# Patient Record
Sex: Female | Born: 1975 | Race: Black or African American | Hispanic: No | Marital: Married | Smoking: Never smoker
Health system: Southern US, Community
[De-identification: ages and names within clinical notes are randomized; demographics above are authoritative.]

## PROBLEM LIST (undated history)

## (undated) HISTORY — PX: CHOLECYSTECTOMY: SHX55

---

## 2000-08-20 ENCOUNTER — Other Ambulatory Visit: Admission: RE | Admit: 2000-08-20 | Discharge: 2000-08-20 | Payer: Self-pay | Admitting: Obstetrics & Gynecology

## 2001-10-21 ENCOUNTER — Other Ambulatory Visit: Admission: RE | Admit: 2001-10-21 | Discharge: 2001-10-21 | Payer: Self-pay | Admitting: Obstetrics & Gynecology

## 2002-06-16 ENCOUNTER — Observation Stay (HOSPITAL_COMMUNITY): Admission: RE | Admit: 2002-06-16 | Discharge: 2002-06-17 | Payer: Self-pay | Admitting: General Surgery

## 2002-06-16 ENCOUNTER — Encounter (INDEPENDENT_AMBULATORY_CARE_PROVIDER_SITE_OTHER): Payer: Self-pay | Admitting: Specialist

## 2002-06-16 ENCOUNTER — Encounter: Payer: Self-pay | Admitting: General Surgery

## 2002-09-01 ENCOUNTER — Ambulatory Visit (HOSPITAL_COMMUNITY): Admission: RE | Admit: 2002-09-01 | Discharge: 2002-09-01 | Payer: Self-pay | Admitting: Obstetrics & Gynecology

## 2002-09-01 ENCOUNTER — Encounter (INDEPENDENT_AMBULATORY_CARE_PROVIDER_SITE_OTHER): Payer: Self-pay

## 2002-09-01 ENCOUNTER — Encounter (INDEPENDENT_AMBULATORY_CARE_PROVIDER_SITE_OTHER): Payer: Self-pay | Admitting: Specialist

## 2002-10-18 ENCOUNTER — Inpatient Hospital Stay (HOSPITAL_COMMUNITY): Admission: RE | Admit: 2002-10-18 | Discharge: 2002-10-19 | Payer: Self-pay | Admitting: Obstetrics & Gynecology

## 2002-10-18 ENCOUNTER — Encounter (INDEPENDENT_AMBULATORY_CARE_PROVIDER_SITE_OTHER): Payer: Self-pay | Admitting: Specialist

## 2002-10-18 ENCOUNTER — Encounter (INDEPENDENT_AMBULATORY_CARE_PROVIDER_SITE_OTHER): Payer: Self-pay

## 2002-11-07 ENCOUNTER — Other Ambulatory Visit: Admission: RE | Admit: 2002-11-07 | Discharge: 2002-11-07 | Payer: Self-pay | Admitting: Obstetrics & Gynecology

## 2003-07-24 ENCOUNTER — Emergency Department (HOSPITAL_COMMUNITY): Admission: EM | Admit: 2003-07-24 | Discharge: 2003-07-25 | Payer: Self-pay | Admitting: Emergency Medicine

## 2003-12-29 ENCOUNTER — Other Ambulatory Visit: Admission: RE | Admit: 2003-12-29 | Discharge: 2003-12-29 | Payer: Self-pay | Admitting: Obstetrics & Gynecology

## 2004-10-31 ENCOUNTER — Ambulatory Visit: Payer: Self-pay | Admitting: Internal Medicine

## 2004-11-01 ENCOUNTER — Emergency Department (HOSPITAL_COMMUNITY): Admission: EM | Admit: 2004-11-01 | Discharge: 2004-11-02 | Payer: Self-pay | Admitting: Emergency Medicine

## 2005-06-06 ENCOUNTER — Other Ambulatory Visit: Admission: RE | Admit: 2005-06-06 | Discharge: 2005-06-06 | Payer: Self-pay | Admitting: Obstetrics and Gynecology

## 2006-07-07 ENCOUNTER — Ambulatory Visit: Payer: Self-pay | Admitting: Internal Medicine

## 2006-07-07 LAB — CONVERTED CEMR LAB
ALT: 15 units/L (ref 0–40)
AST: 16 units/L (ref 0–37)
Alkaline Phosphatase: 81 units/L (ref 39–117)
Basophils Relative: 0.4 % (ref 0.0–1.0)
Bilirubin, Direct: 0.1 mg/dL (ref 0.0–0.3)
CO2: 28 meq/L (ref 19–32)
Calcium: 8.8 mg/dL (ref 8.4–10.5)
Chloride: 108 meq/L (ref 96–112)
Eosinophils Absolute: 0.1 10*3/uL (ref 0.0–0.6)
Eosinophils Relative: 2.1 % (ref 0.0–5.0)
GFR calc non Af Amer: 104 mL/min
Glucose, Bld: 108 mg/dL — ABNORMAL HIGH (ref 70–99)
Ketones, ur: NEGATIVE mg/dL
LDL Cholesterol: 128 mg/dL — ABNORMAL HIGH (ref 0–99)
Nitrite: NEGATIVE
Platelets: 323 10*3/uL (ref 150–400)
RBC: 3.2 M/uL — ABNORMAL LOW (ref 3.87–5.11)
Specific Gravity, Urine: 1.025 (ref 1.000–1.03)
Total CHOL/HDL Ratio: 3.7
Total Protein, Urine: NEGATIVE mg/dL
Urine Glucose: NEGATIVE mg/dL
WBC: 5.6 10*3/uL (ref 4.5–10.5)
pH: 6 (ref 5.0–8.0)

## 2006-07-08 ENCOUNTER — Ambulatory Visit (HOSPITAL_COMMUNITY): Admission: RE | Admit: 2006-07-08 | Discharge: 2006-07-08 | Payer: Self-pay | Admitting: Obstetrics & Gynecology

## 2006-07-13 ENCOUNTER — Ambulatory Visit: Payer: Self-pay | Admitting: Internal Medicine

## 2007-01-02 ENCOUNTER — Encounter: Payer: Self-pay | Admitting: *Deleted

## 2007-12-16 ENCOUNTER — Encounter: Admission: RE | Admit: 2007-12-16 | Discharge: 2007-12-16 | Payer: Self-pay | Admitting: Chiropractic Medicine

## 2009-07-02 ENCOUNTER — Ambulatory Visit: Payer: Self-pay | Admitting: Internal Medicine

## 2009-07-02 LAB — CONVERTED CEMR LAB
ALT: 18 units/L (ref 0–35)
AST: 16 units/L (ref 0–37)
Albumin: 3.8 g/dL (ref 3.5–5.2)
Alkaline Phosphatase: 82 units/L (ref 39–117)
BUN: 8 mg/dL (ref 6–23)
Basophils Relative: 0.3 % (ref 0.0–3.0)
Bilirubin, Direct: 0.1 mg/dL (ref 0.0–0.3)
Chloride: 107 meq/L (ref 96–112)
Cholesterol: 175 mg/dL (ref 0–200)
Creatinine, Ser: 0.8 mg/dL (ref 0.4–1.2)
Eosinophils Relative: 1.9 % (ref 0.0–5.0)
Glucose, Bld: 98 mg/dL (ref 70–99)
Hemoglobin: 12.6 g/dL (ref 12.0–15.0)
Ketones, ur: NEGATIVE mg/dL
LDL Cholesterol: 104 mg/dL — ABNORMAL HIGH (ref 0–99)
Lymphocytes Relative: 31.5 % (ref 12.0–46.0)
MCHC: 33.9 g/dL (ref 30.0–36.0)
Monocytes Relative: 8.2 % (ref 3.0–12.0)
Neutro Abs: 3.2 10*3/uL (ref 1.4–7.7)
Neutrophils Relative %: 58.1 % (ref 43.0–77.0)
RBC: 3.78 M/uL — ABNORMAL LOW (ref 3.87–5.11)
Total CHOL/HDL Ratio: 3
Total Protein, Urine: NEGATIVE mg/dL
Total Protein: 8.1 g/dL (ref 6.0–8.3)
Urine Glucose: NEGATIVE mg/dL
Urobilinogen, UA: 0.2 (ref 0.0–1.0)
WBC: 5.5 10*3/uL (ref 4.5–10.5)

## 2009-07-09 ENCOUNTER — Ambulatory Visit: Payer: Self-pay | Admitting: Internal Medicine

## 2009-07-09 DIAGNOSIS — A6000 Herpesviral infection of urogenital system, unspecified: Secondary | ICD-10-CM | POA: Insufficient documentation

## 2010-02-21 IMAGING — CR DG THORACOLUMBAR SPINE STANDING SCOLIOSIS
1 series · 3 of 3 positions shown · non-contrast
Comparison: Chest X-ray dated 11/01/2004

CLINICAL DATA: History of thoracolumbar scoliosis.  The patient
complains of pain.

THORACOLUMBAR SCOLIOSIS STUDY - STANDING VIEWS

[Series 1001: view not recorded · 0.40mm/px · 3 of 3 slices shown]
[im 1/3]
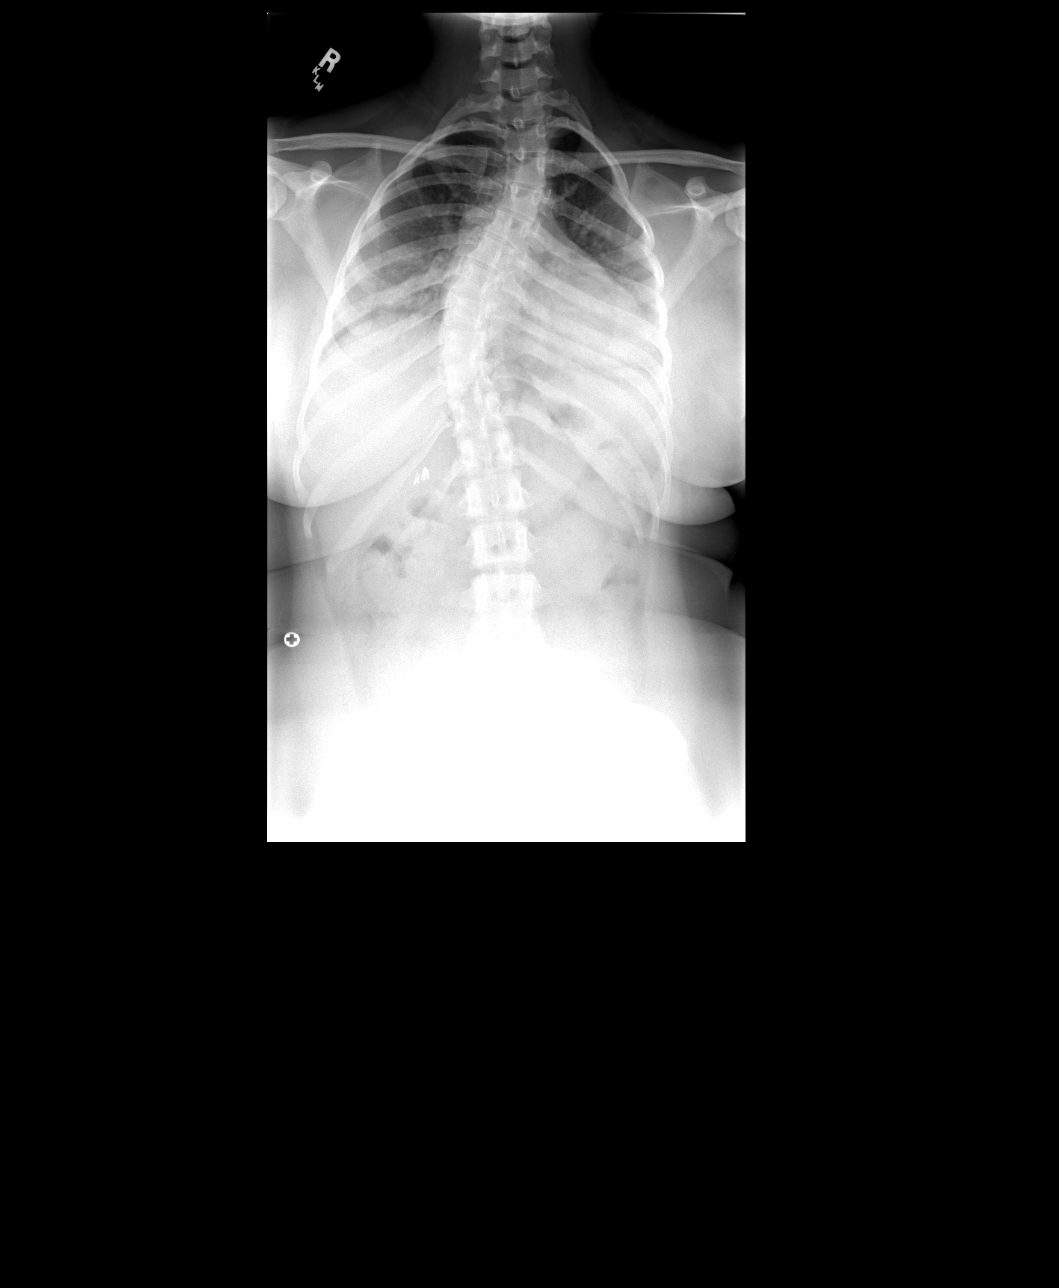
[im 2/3]
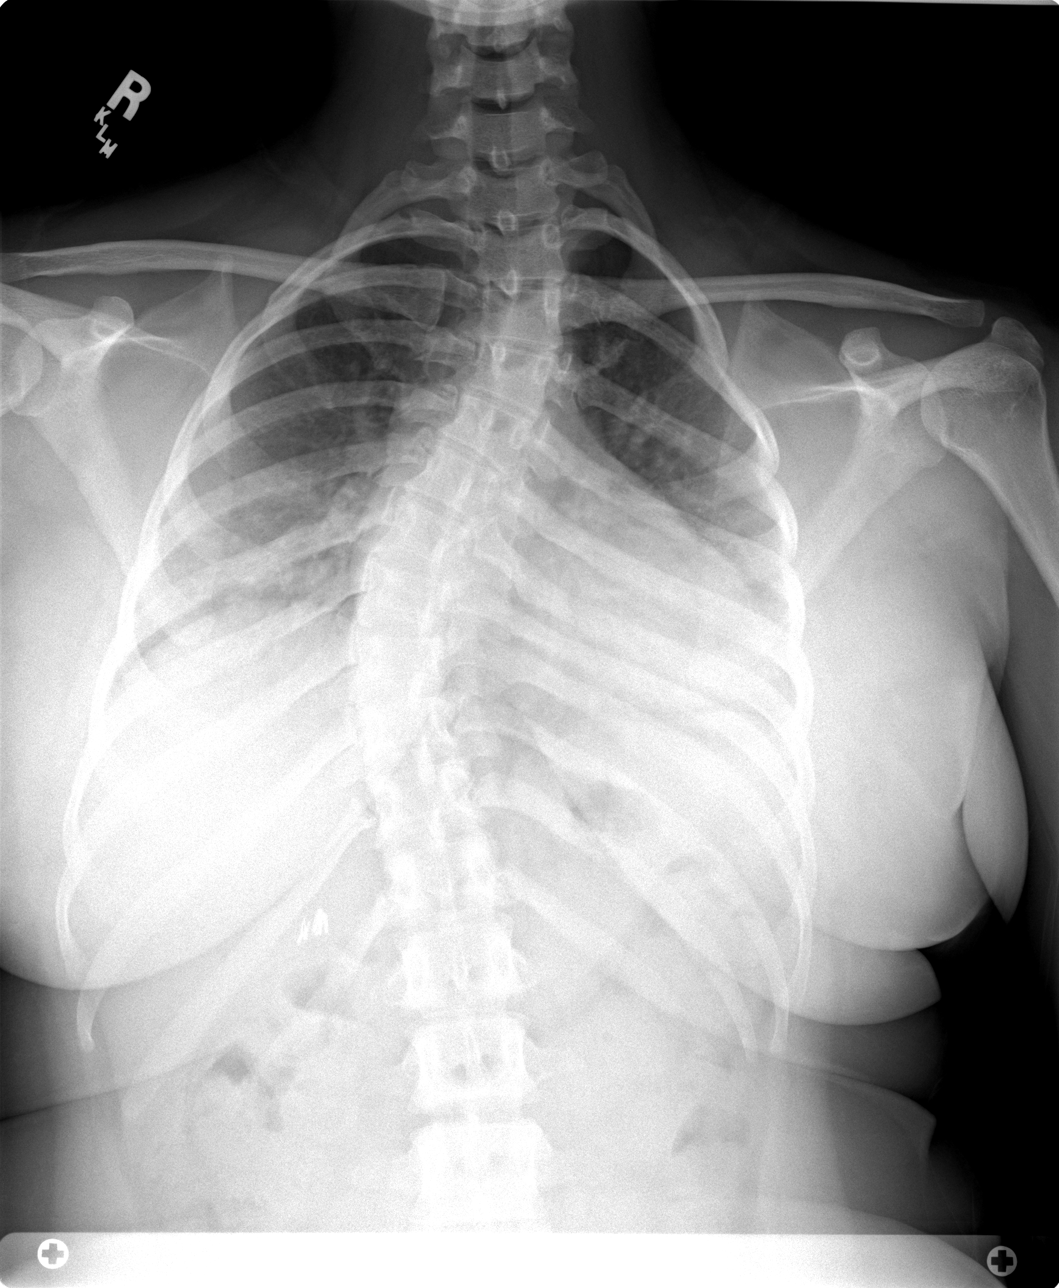
[im 3/3]
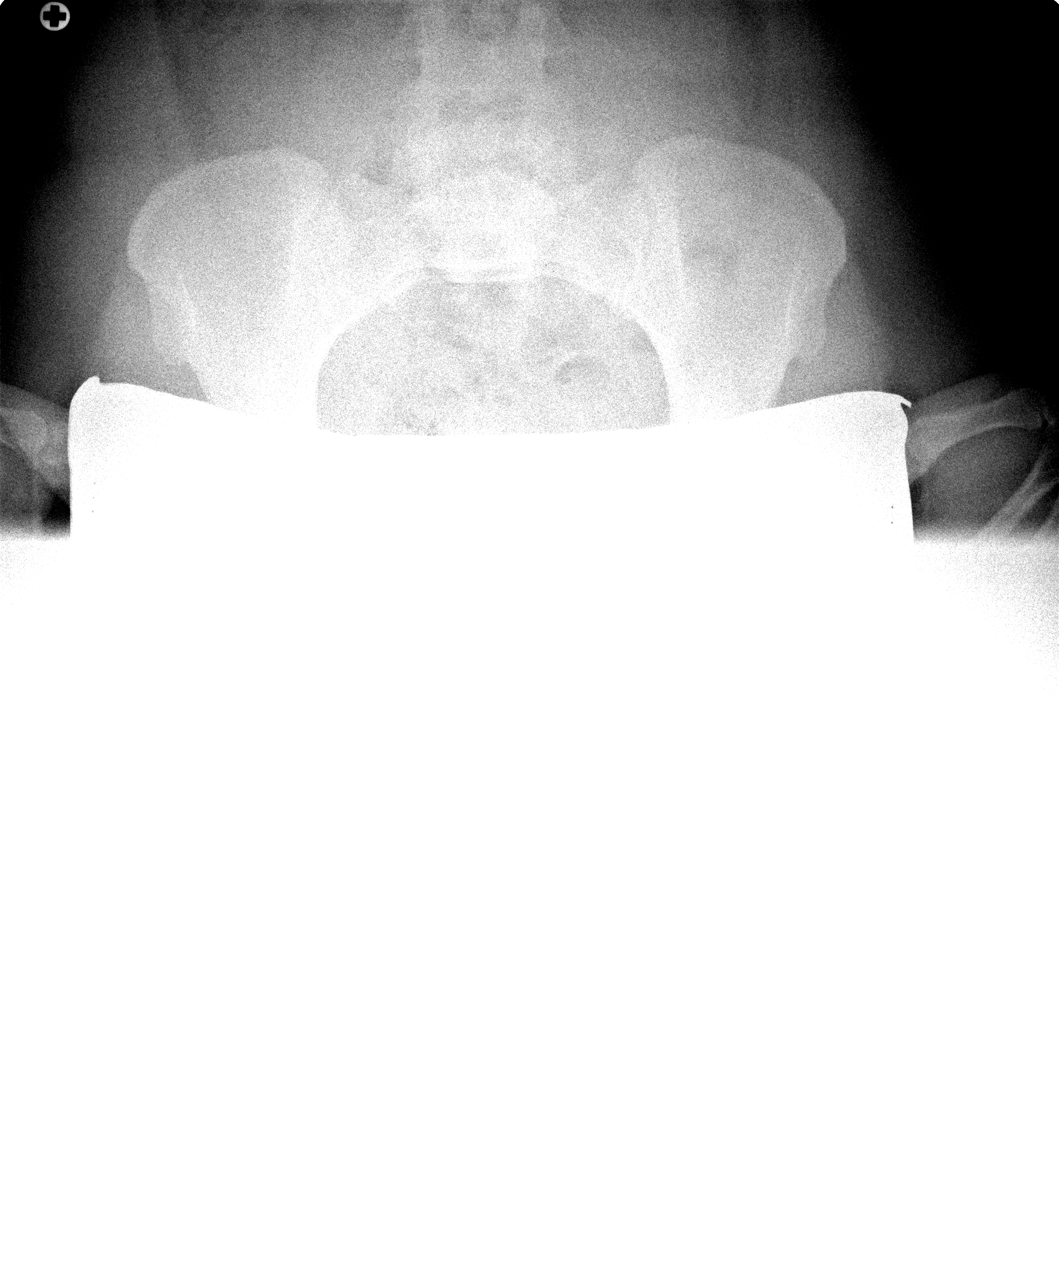

[3 of 3 positions shown; findings below may reference images not displayed]

FINDINGS: Compared to the prior chest x-ray, standing upright
thoracolumbar film demonstrates more pronounced thoracic scoliosis,
convex right.

Apex of curvature is centered at the T7-8 level.  Cobb angle was
estimated to be 31 degrees when measuring the angle between lines
drawn through the superior endplate of T5 and the inferior endplate
of T10.  No congenital bony abnormalities are identified in the
frontal projection.
IMPRESSION: Severe thoracic scoliosis which appears more pronounced compared to
a chest x-ray 3 years ago.  Apex of curvature is at the T7-8 level,
convex right.  Estimated Cobb angle is 31 degrees.

## 2010-06-11 NOTE — Assessment & Plan Note (Signed)
Summary: cpx,#/cd   Vital Signs:  Patient profile:   35 year old female Height:      61 inches Weight:      172 pounds BMI:     32.62 Temp:     99.9 degrees F oral Pulse rate:   93 / minute BP sitting:   102 / 62  (left arm)  Vitals Entered By: Tora Perches (July 09, 2009 9:26 AM) CC: cpx Is Patient Diabetic? No   CC:  cpx.  History of Present Illness: The patient presents for a wellness examination C/o genital rash q 1-2 months - painful blisters x years  Preventive Screening-Counseling & Management  Alcohol-Tobacco     Smoking Status: never  Caffeine-Diet-Exercise     Does Patient Exercise: yes  Current Medications (verified): 1)  None  Allergies (verified): No Known Drug Allergies  Past History:  Past Surgical History: Last updated: 01/02/2007 Cholecystectomy  Past Medical History: Gen herpes  Family History: GM cerv Ca  Social History: Occupation: event planner Married 12 and 16 Never Smoked Alcohol use-no Regular exercise-yes Zumba Does Patient Exercise:  yes  Review of Systems  The patient denies anorexia, fever, weight loss, weight gain, vision loss, decreased hearing, hoarseness, chest pain, syncope, dyspnea on exertion, peripheral edema, prolonged cough, headaches, hemoptysis, abdominal pain, melena, hematochezia, severe indigestion/heartburn, hematuria, incontinence, genital sores, muscle weakness, suspicious skin lesions, transient blindness, difficulty walking, depression, unusual weight change, abnormal bleeding, enlarged lymph nodes, angioedema, and breast masses.    Physical Exam  General:  overweight-appearing.   Head:  Normocephalic and atraumatic without obvious abnormalities. No apparent alopecia or balding. Eyes:  No corneal or conjunctival inflammation noted. EOMI. Perrla.  Ears:  External ear exam shows no significant lesions or deformities.  Otoscopic examination reveals clear canals, tympanic membranes are intact  bilaterally without bulging, retraction, inflammation or discharge. Hearing is grossly normal bilaterally. Nose:  External nasal examination shows no deformity or inflammation. Nasal mucosa are pink and moist without lesions or exudates. Mouth:  Oral mucosa and oropharynx without lesions or exudates.  Teeth in good repair. Neck:  No deformities, masses, or tenderness noted. Lungs:  Normal respiratory effort, chest expands symmetrically. Lungs are clear to auscultation, no crackles or wheezes. Heart:  Normal rate and regular rhythm. S1 and S2 normal without gallop, murmur, click, rub or other extra sounds. Abdomen:  Bowel sounds positive,abdomen soft and non-tender without masses, organomegaly or hernias noted. Msk:  No deformity or scoliosis noted of thoracic or lumbar spine.   Pulses:  R and L carotid,radial,femoral,dorsalis pedis and posterior tibial pulses are full and equal bilaterally Extremities:  No clubbing, cyanosis, edema, or deformity noted with normal full range of motion of all joints.   Neurologic:  No cranial nerve deficits noted. Station and gait are normal. Plantar reflexes are down-going bilaterally. DTRs are symmetrical throughout. Sensory, motor and coordinative functions appear intact. Skin:  Intact without suspicious lesions or rashes Cervical Nodes:  No lymphadenopathy noted Inguinal Nodes:  No significant adenopathy Psych:  Cognition and judgment appear intact. Alert and cooperative with normal attention span and concentration. No apparent delusions, illusions, hallucinations   Impression & Recommendations:  Problem # 1:  PHYSICAL EXAMINATION (ICD-V70.0) Assessment New Health and age related issues were discussed. Available screening tests and vaccinations were discussed as well. Healthy life style including good diet and execise was discussed. GYN visit/PAP q 12 months  The labs were reviewed with the patient.  Orders: EKG w/ Interpretation (93000)  Problem # 2:  HERPES GENITALIS (ICD-054.10) Assessment: Unchanged See "Patient Instructions". Condoms. Acyclovir as needed or once daily if needed  Complete Medication List: 1)  Vitamin D 1000 Unit Tabs (Cholecalciferol) .Marland Kitchen.. 1 by mouth qd 2)  Acyclovir 400 Mg Tabs (Acyclovir) .Marland Kitchen.. 1 by mouth three times a day as needed herpes x 7 d  Patient Instructions: 1)  Try to eat more raw plant food, fresh and dry fruit, raw almonds, leafy vegetables, whole foods and less red meat, less animal fat. Poultry and fish is better for you than pork and beef. Avoid processed foods (canned soups, hot dogs, sausage, bacon , frozen dinners). Avoid corn syrup, high fructose syrup or aspartam and Splenda  containing drinks. Honey, Agave and Stevia are better sweeteners. Make your own  dressing with olive oil, wine vinegar, lemon juce, garlic etc. for your salads. 2)  Please schedule a follow-up appointment in 1 year. Prescriptions: ACYCLOVIR 400 MG TABS (ACYCLOVIR) 1 by mouth three times a day as needed herpes x 7 d  #21 x 3   Entered and Authorized by:   Tresa Garter MD   Signed by:   Tresa Garter MD on 07/09/2009   Method used:   Electronically to        [facility] [address] (retail)       [phone]       [place], [place]  [address]       Ph: [phone]       Fax: [phone]   RxID:   [id number] ACYCLOVIR 400 MG TABS (ACYCLOVIR) 1 by mouth three times a day as needed herpes x 7 d  #21 x 3   Entered and Authorized by:   Tresa Garter MD   Signed by:   Tresa Garter MD on 07/09/2009   Method used:   Print then Give to Patient   RxID:   [id number]

## 2010-09-27 NOTE — Op Note (Signed)
NAMEROBI, [name]                       ACCOUNT NO.:  [id number]   MEDICAL RECORD NO.:  [id number]                   PATIENT TYPE:  AMB   LOCATION:  SDC                                  FACILITY:  [facility]   PHYSICIAN:  Freddy Finner, M.D.                DATE OF BIRTH:  [birth year]   DATE OF PROCEDURE:  09/01/2002  DATE OF DISCHARGE:                                 OPERATIVE REPORT   PREOPERATIVE DIAGNOSES:  1. Dysfunctional uterine bleeding in spite of Ortho Evra contraceptive skin     patches.  2. Sonohysterogram showing filling defect in uterine cavity.  3. Persistent left adnexal mass.   POSTOPERATIVE DIAGNOSES:  1. No definite evidence of polyp on hysteroscopy but thickening of anterior     wall with suggestion of small polyps in left uterine cavity.  2. Pelvic endometriosis.  3. Marked enlargement of left ovary, not felt to be safely approachable     laparoscopically for resection of mass.   OPERATIVE PROCEDURE:  1. Hysteroscopy D&C and removal of polypoid-like tissue and thickened     anterior endometrium.  2. Laparoscopy.  3. Fulguration of pelvic endometriosis.  4. Peritoneal washings.   ESTIMATED INTRAOPERATIVE BLOOD LOSS:  10 mL.   INTRAOPERATIVE COMPLICATIONS:  None.   INDICATIONS FOR PROCEDURE:  The patient is a 35 year old who has been on  Ortho Evra contraceptive patch and has had recurrent break-through bleeding  with heavy clotting and severe pain over the last four to five cycles of her  menses.  On evaluation in the office which included sonohysterogram, she was  found to have a filling defect in the uterine cavity.  She also was found to  have intramural uterine leiomyomata, the largest measuring 1.8 x 1.3 cm.  There was a 4.7 x 4.2 solid mass in the left adnexa suggestive at least to  the sonographer of possible endometrioma.  She is admitted now for  hysteroscopy D&C and laparoscopy.   DESCRIPTION OF PROCEDURE:  She was admitted on the morning of  surgery,  brought to the operating room, placed under adequate general endotracheal  anesthesia, placed in the dorsal lithotomy position using the [facility] stirrup  system.  Betadine prep of the abdomen, perineum, and vagina was carried out  with Betadine solution.  The bladder was evacuated with a Robinson catheter.  Sterile drapes were applied.  The cervix was visualized using a bivalve  speculum.  It was grasped on the anterior lip with a single tooth tenaculum.  The cervix and uterus sounded to 8.5 cm.  The cervix was dilated with Pratts  to 23.  A 12.5-degree A[facility]I hysteroscope was introduced using 3% sorbitol as  the distending medium.  Inspection revealed an essentially normal cavity  except for a thickened area on the anterior lower segment and a suggestion  of some very tiny lesions in the left uterus near the cornu.  Thorough  curettage  was carried out to sample and remove all of these tissue  fragments, followed by exploration with Randall stone forceps.  Repeat  inspection revealed adequate sampling.   A Hulka tenaculum was then attached to the cervix and the laparoscopic  procedure performed.  Please note that the sorbitol deficit was only 30 mL.  Two incisions were then made in the abdomen - one at the umbilicus and one  just above the symphysis.  An 11 mm nonbladed disposable trocar was  introduced through the umbilical incision.  Initially it was felt that there  were significant intraabdominal adhesions because a definite loop of small  bowel could be seen in the properitoneal space, which was insufflated  initially.  Repeat attempt allowed placement of the trocar within the  peritoneal cavity with no evidence of injury on entry.  Careful examination  of the pelvic contents revealed the uterus itself to be enlarged.  The right  tube and ovary were normal.  The left tube was normal.  The left ovary was  enlarged to approximately 6-7 cm.  It had a smooth, glistening external   capsule with no excrescences.  Based on the solid findings on the ultrasound  and the large size of the mass, it was elected not to resect it laparoscopy.  Peritoneal washings were taken.  Scattered lesions of endometriosis were  noted along the left pelvic sidewall and in the cul-de-sac and on the  uterosacrals.  These were photographed.  All of the findings were  photographed and photographs retained in the office record.  All visible  evidence of endometriosis in the above locations was fulgurated with the  bipolar forceps.  Peritoneal washings were taken before the fulguration and  submitted separately.  All instruments were then removed.  Gas was allowed  to escape from the abdomen.  The skin incisions were closed with interrupted  subcuticular sutures of 3-0 Dexon.  Steri-Strips were applied to the lower  incision.  The patient was awakened and taken to recovery in good condition.  It is anticipated that a separate operative procedure will be scheduled for  mini laparotomy with resection of left ovarian mass.                                               Freddy Finner, M.D.    WRN/MEDQ  D:  09/01/2002  T:  09/01/2002  Job:  [id number]

## 2010-09-27 NOTE — Op Note (Signed)
NAME:  [name], [name]                       ACCOUNT NO.:  [id number]   MEDICAL RECORD NO.:  [id number]                   PATIENT TYPE:   LOCATION:                                       FACILITY:   PHYSICIAN:  Freddy Finner, M.D.                DATE OF BIRTH:  1976/04/18   DATE OF PROCEDURE:  10/18/2002  DATE OF DISCHARGE:                                 OPERATIVE REPORT   PREOPERATIVE DIAGNOSIS:  Probable dermoid cyst of the left ovary, secondary  clear cyst with septum.   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   PROCEDURE:  Laparotomy with left ovarian cystectomy removing both cysts.   SURGEON:  Freddy Finner, M.D.   ASSISTANT:  Raynald Kemp, M.D.   ESTIMATED BLOOD LOSS:  20 cc.   COMPLICATIONS:  None.   DISPOSITION:  The patient was awakened and brought to the recovery room in  good condition.   DESCRIPTION OF PROCEDURE:  The patient was admitted on the morning of the  surgery. She was placed on __________. She was brought to the operating room  and placed under good general anesthesia and placed in the dorsal recumbent  position. Her abdomen was prepped in the usual fashion with Betadine  solution. A Foley catheter was placed using sterile technique. Sterile  drapes were applied.   A lower abdominal transverse incision was made and carried sharply down the  fascia. The fascia was entered sharply and extended to the external skin  incision. Bleeding subcutaneous and subfascial vessels were controlled with  the Bovie. The rectus sheath was developed superiorly and inferiorly with  blunt and sharp dissection. The rectus muscles were divided in the midline.  The peritoneum was entered sharply and extended bluntly and sharply to  extend the skin  incision.   There was some serous fluid in the abdomen on entry. Additional irrigation  solution was placed and the aspirate and peritoneal  washings were submitted  for examination. With great care the left fallopian tube and ovary were  elevated into the operative field with a heavily moistened towel as well as  placing moistened laps beneath the ovary in the peritoneal cavity.   A superficial incision was made with the scalpel over the cyst. With  progressive, careful sharp and blunt dissection, both cysts were completely  excised from the ovary. There was leakage from the dermoid in 3 different  locations during the dissection process. Careful inspection revealed  complete excision of the cyst lining of  both cysts.   The ovary was then essentially bivalve conditioned and the cortex was closed  with a running baseball suture of 4-0 PDS, running from inferior to superior  on each side and completely closing the ovary. __________ was carried out.  It was felt that minimal if any dermoid material spilled into the abdomen,  and the irrigation was used for removal. Hemostasis was noted to be adequate  with the ovary. It was allowed to fall back into the abdominal cavity again.  Irrigation was carried out.   The abdominal incision was closed in layers. Running 0 Vicryl was used to  close the peritoneum and reapproximate the rectus muscles. The fascia was  closed  with running 0 PDS running from angle to angle on either  side. The  subcu was approximated with interrupted 0 Monocryl pop offs. The skin was  closed with broad skin staples and covered with Steri-Strips.   The patient tolerated the procedure well. She was taken to recovery in good  condition.                                               Freddy Finner, M.D.    WRN/MEDQ  D:  10/18/2002  T:  10/18/2002  Job:  [id number]

## 2010-09-27 NOTE — Discharge Summary (Signed)
   NAMESVEA, PUSCH                       ACCOUNT NO.:  [id number]   MEDICAL RECORD NO.:  [id number]                   PATIENT TYPE:  INP   LOCATION:  9309                                 FACILITY:  [facility]   PHYSICIAN:  Freddy Finner, M.D.                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   OPERATIVE PROCEDURE:  Mini-laparotomy with left ovarian cystectomy.   POSTOPERATIVE COMPLICATIONS:  None.   DISPOSITION:  The patient was in satisfactory and improved condition at the  time of her discharge.  She was to take a regular diet, and is __________  physical activity.  She is to return to the office approximately three to  four days after discharge for staple removal.  She is to take Percocet for  pain.   Details of the present illness recorded in nursing note, review of systems,  and physical exam.   Physical findings were primarily remarkable for a persistent left adnexal  mass most consistent with a dermoid cyst of the left ovary.   LABORATORY DATA:  Initial hemoglobin was 13.2, postoperative hemoglobin  11.1.  Admission prothrombin time and EKG __________ were normal, as was the  urinalysis.   HOSPITAL COURSE:  The patient was admitted on the morning of surgery.  The  laparoscopic procedure was accomplished without significant difficulty or  intraoperative complications.  Postoperative course was entirely  satisfactory.  On the first postoperative day the patient was requesting  discharge.  She was having adequate physical activity, she was having  adequate bowel and bladder function.  She was discharged home with further  disposition as noted above.                                                Freddy Finner, M.D.    WRN/MEDQ  D:  12/16/2002  T:  12/16/2002  Job:  [id number]

## 2010-09-27 NOTE — Assessment & Plan Note (Signed)
[facility]                           PRIMARY CARE OFFICE NOTE   [name], [name]                    MRN:          [id number]  DATE:07/13/2006                            DOB:          February 20, 1976    The patient is a 35 year old female who presents for a wellness  examination.   PAST MEDICAL HISTORY, FAMILY HISTORY, SOCIAL HISTORY:  As per report  August 01, 2003 note.  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   ALLERGIES:  None.   CURRENT MEDICINES:  None.   REVIEW OF SYSTEMS:  Regular periods.  Regular GYN appointments.  No  chest pain or shortness of breath.  She is unhappy about the weight  gain.  No syncope.  The rest of the 18 point review of system is  negative.   PHYSICAL EXAMINATION:  Blood pressure 118/74, pulse 73, temperature  98.4, weight 197 pounds.  She is in no acute distress, overweight.  HEENT:  Moist mucosa.  NECK:  Supple.  No thyromegaly, bruit.  LUNGS:  Were clear to auscultation and percussion.  No wheeze or rales.  HEART:  Normal S1, S2.  No murmur, no gallop.  ABDOMEN:  Soft, nontender, no organomegaly or masses felt.  LOWER EXTREMITIES:  Without edema.  She is alert and cooperative.  Denies being depressed.  SKIN:  Clear.   Labs on July 07, 2006 hemoglobin 10.7, glucose 108, cholesterol 191,  TSH normal.  Urinalysis normal.   ASSESSMENT AND PLAN:  1. Normal wellness examination.  Age/health related issues discussed.      Healthy lifestyle discussed.  She will start exercising and will      try to lose weight.  Appropriate calorie intake discussed.  2. Acquired anemia, likely iron deficient.  She will take over the      counter iron.  Recheck in 3      months CBC, iron test.  3. Slightly elevated glucose.  Encouraged her desire to lose weight.      Appropriate diet discussed.  Repeat in a few months.     Georgina Quint. Plotnikov, MD  Electronically  Signed    AVP/MedQ  DD: 07/15/2006  DT: 07/15/2006  Job #: [id number]

## 2010-09-27 NOTE — Op Note (Signed)
NAMEJERMIKA, [name]                       ACCOUNT NO.:  [id number]   MEDICAL RECORD NO.:  [id number]                   PATIENT TYPE:  AMB   LOCATION:  DAY                                  FACILITY:  [facility]   PHYSICIAN:  Timothy E. Earlene Plater, M.D.              DATE OF BIRTH:  11-13-1975   DATE OF PROCEDURE:  06/16/2002  DATE OF DISCHARGE:                                 OPERATIVE REPORT   PREOPERATIVE DIAGNOSIS:  Cholecystolithiasis, symptomatic.   POSTOPERATIVE DIAGNOSIS:  Cholecystolithiasis, symptomatic.   PROCEDURE:  Laparoscopic cholecystectomy and operative cholangiogram.   SURGEON:  Timothy E. Earlene Plater, M.D.   ASSISTANT:  Angelia Mould. Derrell Lolling, M.D.   ANESTHESIA:  CRNA supervised, Dr. Quentin Cornwall. [place]enny. Radiologist Dr.  Fredia Sorrow.   INDICATIONS:  The patient is 26, healthy, has symptomatic gallstones, as  well reflux and a positive family history. She is otherwise well. Her  chemistry values are normal and she is ready to proceed with this surgery as  planned.   DESCRIPTION OF PROCEDURE:  She is identified in the preoperative area,  permit signed and evaluated by anesthesia. She was taken to the operating  room and placed supine and general endotracheal anesthesia was administered.  The abdomen was scrubbed, prepped and draped in the usual sterile fashion.   Using 0.25% Marcaine with epinephrine prior to  each incision, an  infraumbilical incision was made. The fascia was identified and opened  vertically. The peritoneum was entered without complication. The [facility]  catheter was placed, tied in place and the abdomen insufflated.   A lower abdominal laparoscopy was thought to be negative. A 10 mm trocar was  placed in the midepigastrium under direct visualization, as well two 5 mm  trocars in the right upper quadrant under direct visualization.   The gallbladder was long, obviously had stones. It was grasped and placed on  tension. The infundibulum of the gallbladder was  convoluted leading into the  cystic duct. The cystic artery was actually spiraling around  the cystic  duct. It was carefully dissected at the infundibulum off the cystic duct and  doubly clipped and divided. The  cystic duct was further dissected out as it  joined the infundibulum, and a clip was placed over the infundibulum of the  gallbladder.   The cystic duct was then opened. A lot of debris was removed from the cystic  duct, and then using the Riddick catheter, placed percutaneously and in real-  time fluoroscopy, a cholangiogram was made with slow, steady infusion. The  common bile duct, common hepatic duct  and hepatic radicals showed up  nicely. Finally the dye did flow into the duodenum. I did ask Dr. Fredia Sorrow  to view this real time with me, and he thought there were no  abnormalities  and there were no stones. I agreed.   The catheter was removed. The cystic duct was further delineated and triply  clipped and  then divided. The gallbladder was removed from the gallbladder  bed without incident or complication, although there was some inflammation  under  the bed of  the gallbladder. The bed was dry. It was copiously  irrigated. There was no bleeding, no bile.   The gallbladder  was then brought up to the infraumbilical wound where it  was opened and multiple stones were removed so that the gallbladder could be  removed itself through the infraumbilical incision. Then under direct vision  that  incision was closed with a #1 Vicryl, having  been previously placed.   Further irrigation was carried out and then as much irrigant as possible was  removed. All air was removed and the trocars and the instruments were  removed under direct visualization. The patient tolerated it well. The skin  incision was closed with 3-0 Monocryl. Steri-Strips were applied.   Final counts were correct. She tolerated it well. She was awakened and taken  to the recovery room in good  condition.                                               Timothy E. Earlene Plater, M.D.    TED/MEDQ  D:  06/16/2002  T:  06/16/2002  Job:  [id number]   cc:   Georgina Quint. Plotnikov, M.D. [facility]  [address]  [place]  [place] [address]  Fax: 1

## 2010-09-27 NOTE — H&P (Signed)
   NAME:  [name], [name]                       ACCOUNT NO.:  [id number]   MEDICAL RECORD NO.:  [id number]                   PATIENT TYPE:  INP   LOCATION:  NA                                   FACILITY:  [facility]   PHYSICIAN:  Freddy Finner, M.D.                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   HISTORY OF PRESENT ILLNESS:  The patient is a 35 year old who recently had  hysteroscopy laparoscopy at this facility on 09/01/2002.  She had an  intraoperative finding of pelvic endometriosis.  She also had a cystic mass  in the left ovary which on admission ultrasound was said to have been solid.  Because of the glistening smooth capsule and the size of the mass, it was  not felt surgically resectable with laparoscopy.  It was left in place at  that time.  The patient is now admitted for mini laparotomy and resection of  left ovarian cyst.   REVIEW OF SYSTEMS:  Her current Review of Systems is negative.  There are no  cardiopulmonary, GI, or GU complaints.   PAST MEDICAL HISTORY:  The patient has no known significant medical  illnesses.   MEDICATIONS:  She is currently on Ortho Evra but on no other medications.   ALLERGIES:  She is not allergic to any medications.   SOCIAL HISTORY:  She has never had a blood transfusion.  She does not use  cigarettes or alcohol.   FAMILY HISTORY:  Noncontributory.   PHYSICAL EXAMINATION:  HEENT:  Grossly within normal limits.  NECK:  Thyroid gland is not palpably enlarged.  VITAL SIGNS:  Blood pressure in the office was 118/74.  CHEST:  Clear to auscultation.  HEART:  Normal sinus rhythm without murmurs, rubs, or gallops.  ABDOMEN:  Soft and nontender without appreciable organomegaly or palpable  masses.  PELVIC:  As described above.  EXTREMITIES:  Without cyanosis,  clubbing, or edema.   ASSESSMENT:  Persistent left ovarian mass, most consistent with dermoid  cyst.   PLAN:  Mini laparotomy with left ovarian cystectomy.                                               Freddy Finner, M.D.    WRN/MEDQ  D:  10/17/2002  T:  10/17/2002  Job:  [id number]

## 2011-09-01 ENCOUNTER — Encounter: Payer: Self-pay | Admitting: Internal Medicine

## 2011-09-01 ENCOUNTER — Ambulatory Visit (INDEPENDENT_AMBULATORY_CARE_PROVIDER_SITE_OTHER): Payer: BC Managed Care – PPO | Admitting: Internal Medicine

## 2011-09-01 VITALS — BP 100/80 | HR 85 | Temp 98.6°F | Resp 16 | Wt 177.0 lb

## 2011-09-01 DIAGNOSIS — H6120 Impacted cerumen, unspecified ear: Secondary | ICD-10-CM

## 2011-09-01 DIAGNOSIS — H918X9 Other specified hearing loss, unspecified ear: Secondary | ICD-10-CM

## 2011-09-01 NOTE — Progress Notes (Signed)
  Subjective:    Patient ID: [name], female    DOB: October 11, 1975, 36 y.o.   MRN: [id number]  HPI [name] presents with a 4 day history of fullness in her ears and loss of hearing. She had some minor Upper respiratory congestion Thursday. She flew to [place] and Friday had more symptoms. The symptoms were worse on her return flight Saturday. She has had no relief with sudafed. She has had no drainage, no fever, no barotrauma or severe pain. She does admit to using Q-tips to clean her ears.   PMH, FamHx and SocHx reviewed for any changes and relevance.    Review of Systems System review is negative for any constitutional, cardiac, pulmonary, GI or neuro symptoms or complaints other than as described in the HPI.     Objective:   Physical Exam Filed Vitals:   09/01/11 1111  BP: 100/80  Pulse: 85  Temp: 98.6 F (37 C)  Resp: 16   Gen'l- mildly overweight white woman in no distress HEENT - cerumen impaction bilaterally. After irrigation the TM's with minimal erythema, good land marks  Bilateral ear irrigation using warm H20 with peroxide and ear syringe. No complications. Tolerated well.      Assessment & Plan:  Cerumen impaction with hearing loss  Plan - no Q-tips in the ear.

## 2012-10-15 ENCOUNTER — Telehealth: Payer: Self-pay | Admitting: *Deleted

## 2012-10-15 DIAGNOSIS — Z0000 Encounter for general adult medical examination without abnormal findings: Secondary | ICD-10-CM

## 2012-10-15 NOTE — Telephone Encounter (Signed)
CPX labs entered for upcoming CPX appointment.  

## 2012-10-15 NOTE — Telephone Encounter (Signed)
Message copied by Carin Primrose on Fri Oct 15, 2012 11:46 AM ------      Message from: Newell Coral      Created: Thu Oct 07, 2012  9:52 AM      Regarding: cpe sch needs labs       The pt scheduled a cpe and is need of labs  ------

## 2012-10-20 ENCOUNTER — Other Ambulatory Visit (INDEPENDENT_AMBULATORY_CARE_PROVIDER_SITE_OTHER): Payer: BC Managed Care – PPO

## 2012-10-20 DIAGNOSIS — Z0000 Encounter for general adult medical examination without abnormal findings: Secondary | ICD-10-CM

## 2012-10-20 LAB — URINALYSIS, ROUTINE W REFLEX MICROSCOPIC
Bilirubin Urine: NEGATIVE
Leukocytes, UA: NEGATIVE
Nitrite: NEGATIVE
Specific Gravity, Urine: 1.025 (ref 1.000–1.030)
Total Protein, Urine: NEGATIVE
pH: 6 (ref 5.0–8.0)

## 2012-10-20 LAB — LIPID PANEL
HDL: 50.3 mg/dL (ref >39.00)
LDL Cholesterol: 133 mg/dL — ABNORMAL HIGH (ref 0–99)
Total CHOL/HDL Ratio: 4
Triglycerides: 51 mg/dL (ref 0.0–149.0)
VLDL: 10.2 mg/dL (ref 0.0–40.0)

## 2012-10-20 LAB — BASIC METABOLIC PANEL
CO2: 28 mEq/L (ref 19–32)
Calcium: 9.6 mg/dL (ref 8.4–10.5)
Chloride: 103 mEq/L (ref 96–112)
Glucose, Bld: 95 mg/dL (ref 70–99)
Sodium: 138 mEq/L (ref 135–145)

## 2012-10-20 LAB — HEPATIC FUNCTION PANEL
Albumin: 4.1 g/dL (ref 3.5–5.2)
Total Bilirubin: 0.8 mg/dL (ref 0.3–1.2)

## 2012-10-20 LAB — TSH: TSH: 2.17 u[IU]/mL (ref 0.35–5.50)

## 2012-10-20 LAB — CBC WITH DIFFERENTIAL/PLATELET
Basophils Absolute: 0 10*3/uL (ref 0.0–0.1)
Eosinophils Relative: 1 % (ref 0.0–5.0)
HCT: 39.9 % (ref 36.0–46.0)
Hemoglobin: 13.6 g/dL (ref 12.0–15.0)
Lymphocytes Relative: 24 % (ref 12.0–46.0)
Lymphs Abs: 1.7 10*3/uL (ref 0.7–4.0)
Monocytes Relative: 9 % (ref 3.0–12.0)
Platelets: 359 10*3/uL (ref 150.0–400.0)
RDW: 13 % (ref 11.5–14.6)
WBC: 7.1 10*3/uL (ref 4.5–10.5)

## 2012-10-26 ENCOUNTER — Ambulatory Visit (INDEPENDENT_AMBULATORY_CARE_PROVIDER_SITE_OTHER): Payer: BC Managed Care – PPO | Admitting: Internal Medicine

## 2012-10-26 ENCOUNTER — Encounter: Payer: Self-pay | Admitting: Internal Medicine

## 2012-10-26 VITALS — BP 110/74 | HR 72 | Temp 98.2°F | Resp 16 | Ht 60.5 in | Wt 174.0 lb

## 2012-10-26 DIAGNOSIS — Z23 Encounter for immunization: Secondary | ICD-10-CM

## 2012-10-26 DIAGNOSIS — Z0000 Encounter for general adult medical examination without abnormal findings: Secondary | ICD-10-CM | POA: Insufficient documentation

## 2012-10-26 MED ORDER — VITAMIN D 1000 UNITS PO TABS: 1000.0000 [IU] | ORAL_TABLET | Freq: Every day | ORAL | Status: AC

## 2012-10-26 NOTE — Assessment & Plan Note (Signed)
We discussed age appropriate health related issues, including available/recomended screening tests and vaccinations. We discussed a need for adhering to healthy diet and exercise. Labs/EKG were reviewed/ordered. All questions were answered.   

## 2012-10-26 NOTE — Progress Notes (Signed)
  Subjective:    HPI  The patient is here for a wellness exam. The patient has been doing well overall without major physical or psychological issues going on lately. Lost wt  Wt Readings from Last 3 Encounters:  10/26/12 174 lb (78.926 kg)  09/01/11 177 lb (80.287 kg)  07/09/09 172 lb (78.019 kg)   BP Readings from Last 3 Encounters:  10/26/12 110/74  09/01/11 100/80  07/09/09 102/62      Review of Systems  Constitutional: Negative for chills, activity change, appetite change, fatigue and unexpected weight change.  HENT: Negative for congestion, mouth sores and sinus pressure.   Eyes: Negative for visual disturbance.  Respiratory: Negative for cough and chest tightness.   Gastrointestinal: Negative for nausea and abdominal pain.  Genitourinary: Negative for frequency, difficulty urinating and vaginal pain.  Musculoskeletal: Negative for back pain and gait problem.  Skin: Negative for pallor and rash.  Neurological: Negative for dizziness, tremors, weakness, numbness and headaches.  Psychiatric/Behavioral: Negative for confusion and sleep disturbance.   Lab Results  Component Value Date   WBC 7.1 10/20/2012   HGB 13.6 10/20/2012   HCT 39.9 10/20/2012   PLT 359.0 10/20/2012   GLUCOSE 95 10/20/2012   CHOL 193 10/20/2012   TRIG 51.0 10/20/2012   HDL 50.30 10/20/2012   LDLCALC 133* 10/20/2012   ALT 18 10/20/2012   AST 16 10/20/2012   NA 138 10/20/2012   K 4.5 10/20/2012   CL 103 10/20/2012   CREATININE 0.8 10/20/2012   BUN 14 10/20/2012   CO2 28 10/20/2012   TSH 2.17 10/20/2012       Objective:   Physical Exam        Assessment & Plan:

## 2014-03-08 ENCOUNTER — Telehealth: Payer: Self-pay | Admitting: Internal Medicine

## 2014-03-08 NOTE — Telephone Encounter (Signed)
Pt experiencing more frequent urination the last 4-6 weeks. No other symptoms. Pt just wanted noted in her chart. Scheduled for Wellness visit in November.

## 2014-03-09 NOTE — Telephone Encounter (Signed)
Noted. Thx.

## 2014-03-28 ENCOUNTER — Other Ambulatory Visit (INDEPENDENT_AMBULATORY_CARE_PROVIDER_SITE_OTHER): Payer: No Typology Code available for payment source

## 2014-03-28 ENCOUNTER — Encounter: Payer: Self-pay | Admitting: Internal Medicine

## 2014-03-28 ENCOUNTER — Ambulatory Visit (INDEPENDENT_AMBULATORY_CARE_PROVIDER_SITE_OTHER): Payer: No Typology Code available for payment source | Admitting: Internal Medicine

## 2014-03-28 VITALS — BP 100/78 | HR 75 | Temp 98.7°F | Ht 60.5 in | Wt 154.0 lb

## 2014-03-28 DIAGNOSIS — G5712 Meralgia paresthetica, left lower limb: Secondary | ICD-10-CM

## 2014-03-28 DIAGNOSIS — Z0000 Encounter for general adult medical examination without abnormal findings: Secondary | ICD-10-CM

## 2014-03-28 LAB — URINALYSIS
Bilirubin Urine: NEGATIVE
HGB URINE DIPSTICK: NEGATIVE
Ketones, ur: NEGATIVE
LEUKOCYTES UA: NEGATIVE
NITRITE: NEGATIVE
SPECIFIC GRAVITY, URINE: 1.015 (ref 1.000–1.030)
Total Protein, Urine: NEGATIVE
UROBILINOGEN UA: 0.2 (ref 0.0–1.0)
Urine Glucose: NEGATIVE
pH: 7 (ref 5.0–8.0)

## 2014-03-28 LAB — LIPID PANEL
CHOL/HDL RATIO: 3
Cholesterol: 216 mg/dL — ABNORMAL HIGH (ref 0–200)
HDL: 80.8 mg/dL (ref >39.00)
LDL Cholesterol: 128 mg/dL — ABNORMAL HIGH (ref 0–99)
NONHDL: 135.2
Triglycerides: 36 mg/dL (ref 0.0–149.0)
VLDL: 7.2 mg/dL (ref 0.0–40.0)

## 2014-03-28 LAB — CBC WITH DIFFERENTIAL/PLATELET
BASOS PCT: 0.4 % (ref 0.0–3.0)
Basophils Absolute: 0 10*3/uL (ref 0.0–0.1)
EOS ABS: 0 10*3/uL (ref 0.0–0.7)
EOS PCT: 0.8 % (ref 0.0–5.0)
HCT: 41.5 % (ref 36.0–46.0)
HEMOGLOBIN: 13.8 g/dL (ref 12.0–15.0)
LYMPHS PCT: 28.8 % (ref 12.0–46.0)
Lymphs Abs: 1.7 10*3/uL (ref 0.7–4.0)
MCHC: 33.3 g/dL (ref 30.0–36.0)
MCV: 97.4 fl (ref 78.0–100.0)
Monocytes Absolute: 0.5 10*3/uL (ref 0.1–1.0)
Monocytes Relative: 7.8 % (ref 3.0–12.0)
NEUTROS ABS: 3.6 10*3/uL (ref 1.4–7.7)
Neutrophils Relative %: 62.2 % (ref 43.0–77.0)
Platelets: 348 10*3/uL (ref 150.0–400.0)
RBC: 4.26 Mil/uL (ref 3.87–5.11)
RDW: 13.3 % (ref 11.5–15.5)
WBC: 5.8 10*3/uL (ref 4.0–10.5)

## 2014-03-28 LAB — BASIC METABOLIC PANEL
BUN: 13 mg/dL (ref 6–23)
CALCIUM: 9.6 mg/dL (ref 8.4–10.5)
CHLORIDE: 110 meq/L (ref 96–112)
CO2: 23 meq/L (ref 19–32)
Creatinine, Ser: 0.8 mg/dL (ref 0.4–1.2)
GFR: 101.44 mL/min (ref >60.00)
Glucose, Bld: 98 mg/dL (ref 70–99)
Potassium: 4.8 mEq/L (ref 3.5–5.1)
SODIUM: 142 meq/L (ref 135–145)

## 2014-03-28 LAB — HEPATIC FUNCTION PANEL
ALBUMIN: 4.4 g/dL (ref 3.5–5.2)
ALK PHOS: 66 U/L (ref 39–117)
ALT: 17 U/L (ref 0–35)
AST: 19 U/L (ref 0–37)
Bilirubin, Direct: 0.1 mg/dL (ref 0.0–0.3)
TOTAL PROTEIN: 8.4 g/dL — AB (ref 6.0–8.3)
Total Bilirubin: 0.7 mg/dL (ref 0.2–1.2)

## 2014-03-28 LAB — TSH: TSH: 0.91 u[IU]/mL (ref 0.35–4.50)

## 2014-03-28 LAB — VITAMIN B12: VITAMIN B 12: 354 pg/mL (ref 211–911)

## 2014-03-28 NOTE — Progress Notes (Signed)
Pre visit review using our clinic review tool, if applicable. No additional management support is needed unless otherwise documented below in the visit note. 

## 2014-03-28 NOTE — Assessment & Plan Note (Signed)
We discussed age appropriate health related issues, including available/recomended screening tests and vaccinations. We discussed a need for adhering to healthy diet and exercise. Labs/EKG were reviewed/ordered. All questions were answered.  Declined vaccines Dr Jennette KettleNeal - GYN q 12 mo

## 2014-03-28 NOTE — Assessment & Plan Note (Signed)
11/15 resolving

## 2014-03-28 NOTE — Progress Notes (Signed)
   Subjective:    HPI  The patient is here for a wellness exam. The patient has been doing well overall without major physical or psychological issues going on lately. Lost wt - crossfit.     Wt Readings from Last 3 Encounters:  03/28/14 154 lb (69.854 kg)  10/26/12 174 lb (78.926 kg)  09/01/11 177 lb (80.287 kg)   BP Readings from Last 3 Encounters:  03/28/14 100/78  10/26/12 110/74  09/01/11 100/80      Review of Systems  Constitutional: Negative for chills, activity change, appetite change, fatigue and unexpected weight change.  HENT: Negative for congestion, mouth sores and sinus pressure.   Eyes: Negative for visual disturbance.  Respiratory: Negative for cough and chest tightness.   Gastrointestinal: Negative for nausea and abdominal pain.  Genitourinary: Negative for frequency, difficulty urinating and vaginal pain.  Musculoskeletal: Negative for back pain and gait problem.  Skin: Negative for pallor and rash.  Neurological: Negative for dizziness, tremors, weakness, numbness and headaches.  Psychiatric/Behavioral: Negative for confusion and sleep disturbance.        Objective:   Physical Exam  Constitutional: She appears well-developed. No distress.  HENT:  Head: Normocephalic.  Right Ear: External ear normal.  Left Ear: External ear normal.  Nose: Nose normal.  Mouth/Throat: Oropharynx is clear and moist.  Eyes: Conjunctivae are normal. Pupils are equal, round, and reactive to light. Right eye exhibits no discharge. Left eye exhibits no discharge.  Neck: Normal range of motion. Neck supple. No JVD present. No tracheal deviation present. No thyromegaly present.  Cardiovascular: Normal rate, regular rhythm and normal heart sounds.   Pulmonary/Chest: No stridor. No respiratory distress. She has no wheezes.  Abdominal: Soft. Bowel sounds are normal. She exhibits no distension and no mass. There is no tenderness. There is no rebound and no guarding.   Musculoskeletal: She exhibits no edema or tenderness.  Lymphadenopathy:    She has no cervical adenopathy.  Neurological: She displays normal reflexes. No cranial nerve deficit. She exhibits normal muscle tone. Coordination normal.  Skin: No rash noted. No erythema.  Psychiatric: She has a normal mood and affect. Her behavior is normal. Judgment and thought content normal.    Lab Results  Component Value Date   WBC 7.1 10/20/2012   HGB 13.6 10/20/2012   HCT 39.9 10/20/2012   PLT 359.0 10/20/2012   GLUCOSE 95 10/20/2012   CHOL 193 10/20/2012   TRIG 51.0 10/20/2012   HDL 50.30 10/20/2012   LDLCALC 133* 10/20/2012   ALT 18 10/20/2012   AST 16 10/20/2012   NA 138 10/20/2012   K 4.5 10/20/2012   CL 103 10/20/2012   CREATININE 0.8 10/20/2012   BUN 14 10/20/2012   CO2 28 10/20/2012   TSH 2.17 10/20/2012         Assessment & Plan:

## 2019-06-30 ENCOUNTER — Ambulatory Visit: Payer: No Typology Code available for payment source

## 2019-07-04 ENCOUNTER — Ambulatory Visit: Payer: No Typology Code available for payment source | Attending: Family

## 2019-07-04 DIAGNOSIS — Z23 Encounter for immunization: Secondary | ICD-10-CM | POA: Insufficient documentation

## 2019-07-04 NOTE — Progress Notes (Signed)
   Covid-19 Vaccination Clinic  Name:  [name]    MRN: [id number] DOB: July 27, 1975  07/04/2019  [name] was observed post Covid-19 immunization for 15 minutes without incidence. She was provided with Vaccine Information Sheet and instruction to access the V-Safe system.   [name] was instructed to call 911 with any severe reactions post vaccine: Marland Kitchen Difficulty breathing  . Swelling of your face and throat  . A fast heartbeat  . A bad rash all over your body  . Dizziness and weakness    Immunizations Administered    Name Date Dose VIS Date Route   Moderna COVID-19 Vaccine 07/04/2019 12:32 PM 0.5 mL 04/12/2019 Intramuscular   Manufacturer: Moderna   Lot: [id number]   NDC: 12904-753-39

## 2019-08-09 ENCOUNTER — Ambulatory Visit: Payer: No Typology Code available for payment source | Attending: Family

## 2019-08-09 DIAGNOSIS — Z23 Encounter for immunization: Secondary | ICD-10-CM

## 2019-08-09 NOTE — Progress Notes (Signed)
   Covid-19 Vaccination Clinic  Name:  [name]    MRN: [id number] DOB: 01/11/1976  08/09/2019  [name] was observed post Covid-19 immunization for 15 minutes without incident. She was provided with Vaccine Information Sheet and instruction to access the V-Safe system.   [name] was instructed to call 911 with any severe reactions post vaccine: Marland Kitchen Difficulty breathing  . Swelling of face and throat  . A fast heartbeat  . A bad rash all over body  . Dizziness and weakness   Immunizations Administered    Name Date Dose VIS Date Route   Moderna COVID-19 Vaccine 08/09/2019 10:24 AM 0.5 mL 04/12/2019 Intramuscular   Manufacturer: Moderna   Lot: [id number]   NDC: 49201-007-12
